# Patient Record
Sex: Male | Born: 1975 | Race: White | Hispanic: No | Marital: Single | Smoking: Never smoker
Health system: Southern US, Community
[De-identification: ages and names within clinical notes are randomized; demographics above are authoritative.]

---

## 2013-06-01 ENCOUNTER — Encounter (HOSPITAL_COMMUNITY): Payer: Self-pay | Admitting: Emergency Medicine

## 2013-06-01 ENCOUNTER — Emergency Department (HOSPITAL_COMMUNITY)
Admission: EM | Admit: 2013-06-01 | Discharge: 2013-06-01 | Disposition: A | Discharge status: Home / Self Care | Payer: Self-pay | Attending: Emergency Medicine | Admitting: Emergency Medicine

## 2013-06-01 ENCOUNTER — Emergency Department (HOSPITAL_COMMUNITY): Payer: Self-pay

## 2013-06-01 DIAGNOSIS — W1809XA Striking against other object with subsequent fall, initial encounter: Secondary | ICD-10-CM | POA: Insufficient documentation

## 2013-06-01 DIAGNOSIS — Y939 Activity, unspecified: Secondary | ICD-10-CM | POA: Insufficient documentation

## 2013-06-01 DIAGNOSIS — W010XXA Fall on same level from slipping, tripping and stumbling without subsequent striking against object, initial encounter: Secondary | ICD-10-CM | POA: Insufficient documentation

## 2013-06-01 DIAGNOSIS — S20219A Contusion of unspecified front wall of thorax, initial encounter: Secondary | ICD-10-CM | POA: Insufficient documentation

## 2013-06-01 DIAGNOSIS — Y929 Unspecified place or not applicable: Secondary | ICD-10-CM | POA: Insufficient documentation

## 2013-06-01 MED ORDER — DICLOFENAC SODIUM 75 MG PO TBEC: 75.0000 mg | DELAYED_RELEASE_TABLET | Freq: Two times a day (BID) | ORAL | Status: AC

## 2013-06-01 MED ORDER — BACLOFEN 10 MG PO TABS: 10.0000 mg | ORAL_TABLET | Freq: Three times a day (TID) | ORAL | Status: AC

## 2013-06-01 MED ORDER — ACETAMINOPHEN-CODEINE #3 300-30 MG PO TABS: 1.0000 | ORAL_TABLET | Freq: Four times a day (QID) | ORAL | Status: AC | PRN

## 2013-06-01 NOTE — ED Notes (Signed)
Pt alert & oriented x4, stable gait. Patient given discharge instructions, paperwork & prescription(s). Patient  instructed to stop at the registration desk to finish any additional paperwork. Patient verbalized understanding. Pt left department w/ no further questions. 

## 2013-06-01 NOTE — ED Notes (Signed)
Pt reports slipped on wet porch this morning and fell hitting ribs on wooden steps.  C/O pain to r ribs.  Denies SOB but hurts to take a deep breath.

## 2013-06-01 NOTE — ED Provider Notes (Signed)
Medical screening examination/treatment/procedure(s) were performed by non-physician practitioner and as supervising physician I was immediately available for consultation/collaboration.   EKG Interpretation None        Whitney Hillegass W Jhalen Eley, MD 06/01/13 2027 

## 2013-06-01 NOTE — ED Provider Notes (Signed)
CSN: [id number]     Arrival date & time 06/01/13  1632 History   First MD Initiated Contact with Patient 06/01/13 1649     Chief Complaint  Patient presents with  . Fall     (Consider location/radiation/quality/duration/timing/severity/associated sxs/prior Treatment) HPI Comments: Patient is a 38 year old male who presents to the emergency department with complaint of right rib area pain. The patient states that he slipped on a wet porch earlier this morning, he hit his right ribs on a set of steps. Since that time he's been having increasing pain in the right rib and chest area. He denies shortness of breath. The patient denies any hemoptysis. The patient denies having any operations or procedures involving the chest. He denies being on any anticoagulation medications. He has not taken any medications for this discomfort.  Patient is a 38 y.o. male presenting with fall. The history is provided by the patient.  Fall The current episode started today. Pertinent negatives include no abdominal pain, arthralgias, chest pain, coughing or neck pain.    History reviewed. No pertinent past medical history. History reviewed. No pertinent past surgical history. No family history on file. History  Substance Use Topics  . Smoking status: Never Smoker   . Smokeless tobacco: Not on file  . Alcohol Use: No    Review of Systems  Constitutional: Negative for activity change.       All ROS Neg except as noted in HPI  HENT: Negative for nosebleeds.   Eyes: Negative for photophobia and discharge.  Respiratory: Negative for cough, shortness of breath and wheezing.        Chest wall pain  Cardiovascular: Negative for chest pain and palpitations.  Gastrointestinal: Negative for abdominal pain and blood in stool.  Genitourinary: Negative for dysuria, frequency and hematuria.  Musculoskeletal: Negative for arthralgias, back pain and neck pain.  Skin: Negative.   Neurological: Negative for dizziness,  seizures and speech difficulty.  Psychiatric/Behavioral: Negative for hallucinations and confusion.      Allergies  Review of patient's allergies indicates no known allergies.  Home Medications  No current outpatient prescriptions on file. BP 140/71  Pulse 74  Temp(Src) 98 F (36.7 C) (Oral)  Resp 18  Ht 5\' 11"  (1.803 m)  Wt 185 lb (83.915 kg)  BMI 25.81 kg/m2  SpO2 98% Physical Exam  Nursing note and vitals reviewed. Constitutional: He is oriented to person, place, and time. He appears well-developed and well-nourished.  Non-toxic appearance.  HENT:  Head: Normocephalic.  Right Ear: Tympanic membrane and external ear normal.  Left Ear: Tympanic membrane and external ear normal.  Eyes: EOM and lids are normal. Pupils are equal, round, and reactive to light.  Neck: Normal range of motion. Neck supple. Carotid bruit is not present.  Cardiovascular: Normal rate, regular rhythm, normal heart sounds, intact distal pulses and normal pulses.   Pulmonary/Chest: Breath sounds normal. No respiratory distress.  There is pain to palpation of the lateral chest wall on the right. There is no palpable deformity. There is no crepitus. There is symmetrical rise and fall of the chest. The patient speaks in complete sentences without problem.  Abdominal: Soft. Bowel sounds are normal. There is no tenderness. There is no guarding.  Musculoskeletal: Normal range of motion.  Lymphadenopathy:       Head (right side): No submandibular adenopathy present.       Head (left side): No submandibular adenopathy present.    He has no cervical adenopathy.  Neurological: He is alert  and oriented to person, place, and time. He has normal strength. No cranial nerve deficit or sensory deficit.  Skin: Skin is warm and dry.  Psychiatric: He has a normal mood and affect. His speech is normal.    ED Course  Procedures (including critical care time) Labs Review Labs Reviewed - No data to display Imaging  Review No results found.   EKG Interpretation None      MDM Patient slipped on a wet porch and injured the ribs and chest wall on the right. The x-ray of the chest and ribs are negative for any fracture or any acute changes. The pulse oximetry is 98% on room air. Within normal limits by my interpretation.  The plan at this time is for the patient to splint the rib area upper lobe in practice cough and deep breathing several times during the hour. Prescription for baclofen, diclofenac, and Tylenol with Codeine given to the patient. Patient is to see his primary physician, or return to the emergency department if not improving.    Final diagnoses:  None    **I have reviewed nursing notes, vital signs, and all appropriate lab and imaging results for this patient.Kathie Dike*    Vonnie Ligman M Brantley Naser, PA-C 06/01/13 [phone]

## 2013-06-01 NOTE — Discharge Instructions (Signed)
The x-ray of your chest and ribs are negative for any acute changes. Your oxygen level is well within normal limits. Please splint the painful area with a pillow, and practice deep breathing several times during the hour. Please use diclofenac and baclofen daily for muscle spasm soreness and swelling. May use Tylenol codeine for pain if needed, Tylenol codeine and baclofen may cause drowsiness, please use with caution. Chest Contusion A chest contusion is a deep bruise on your chest area. Contusions are the result of an injury that caused bleeding under the skin. A chest contusion may involve bruising of the skin, muscles, or ribs. The contusion may turn blue, purple, or yellow. Minor injuries will give you a painless contusion, but more severe contusions may stay painful and swollen for a few weeks. CAUSES  A contusion is usually caused by a blow, trauma, or direct force to an area of the body. SYMPTOMS   Swelling and redness of the injured area.  Discoloration of the injured area.  Tenderness and soreness of the injured area.  Pain. DIAGNOSIS  The diagnosis can be made by taking a history and performing a physical exam. An X-ray, CT scan, or MRI may be needed to determine if there were any associated injuries, such as broken bones (fractures) or internal injuries. TREATMENT  Often, the best treatment for a chest contusion is resting, icing, and applying cold compresses to the injured area. Deep breathing exercises may be recommended to reduce the risk of pneumonia. Over-the-counter medicines may also be recommended for pain control. HOME CARE INSTRUCTIONS   Put ice on the injured area.  Put ice in a plastic bag.  Place a towel between your skin and the bag.  Leave the ice on for 15-20 minutes, 03-04 times a day.  Only take over-the-counter or prescription medicines as directed by your caregiver. Your caregiver may recommend avoiding anti-inflammatory medicines (aspirin, ibuprofen, and  naproxen) for 48 hours because these medicines may increase bruising.  Rest the injured area.  Perform deep-breathing exercises as directed by your caregiver.  Stop smoking if you smoke.  Do not lift objects over 5 pounds (2.3 kg) for 3 days or longer if recommended by your caregiver. SEEK IMMEDIATE MEDICAL CARE IF:   You have increased bruising or swelling.  You have pain that is getting worse.  You have difficulty breathing.  You have dizziness, weakness, or fainting.  You have blood in your urine or stool.  You cough up or vomit blood.  Your swelling or pain is not relieved with medicines. MAKE SURE YOU:   Understand these instructions.  Will watch your condition.  Will get help right away if you are not doing well or get worse. Document Released: 11/04/2000 Document Revised: 11/04/2011 Document Reviewed: 08/03/2011 [facility] Patient Information 2014 [place], [place].

## 2013-08-16 ENCOUNTER — Emergency Department: Payer: Self-pay | Admitting: Emergency Medicine

## 2013-08-16 LAB — URINALYSIS, COMPLETE
BILIRUBIN, UR: NEGATIVE
BLOOD: NEGATIVE
Glucose,UR: NEGATIVE mg/dL (ref 0–75)
Ketone: NEGATIVE
Nitrite: NEGATIVE
PROTEIN: NEGATIVE
Ph: 6 (ref 4.5–8.0)
Specific Gravity: 1.002 (ref 1.003–1.030)
Squamous Epithelial: NONE SEEN
WBC UR: 3 /HPF (ref 0–5)

## 2013-08-16 LAB — BASIC METABOLIC PANEL
ANION GAP: 6 — AB (ref 7–16)
BUN: 15 mg/dL (ref 7–18)
CALCIUM: 8.4 mg/dL — AB (ref 8.5–10.1)
CHLORIDE: 110 mmol/L — AB (ref 98–107)
Co2: 23 mmol/L (ref 21–32)
Creatinine: 0.87 mg/dL (ref 0.60–1.30)
EGFR (Non-African Amer.): >60
GLUCOSE: 80 mg/dL (ref 65–99)
OSMOLALITY: 277 (ref 275–301)
POTASSIUM: 3.5 mmol/L (ref 3.5–5.1)
Sodium: 139 mmol/L (ref 136–145)

## 2013-08-16 LAB — CBC
HCT: 49.9 % (ref 40.0–52.0)
HGB: 16.6 g/dL (ref 13.0–18.0)
MCH: 30.2 pg (ref 26.0–34.0)
MCHC: 33.2 g/dL (ref 32.0–36.0)
MCV: 91 fL (ref 80–100)
Platelet: 128 10*3/uL — ABNORMAL LOW (ref 150–440)
RBC: 5.49 10*6/uL (ref 4.40–5.90)
RDW: 14.1 % (ref 11.5–14.5)
WBC: 6.8 10*3/uL (ref 3.8–10.6)

## 2014-04-08 ENCOUNTER — Emergency Department: Payer: Self-pay | Admitting: Emergency Medicine
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
